# Patient Record
Sex: Female | Born: 1940
Health system: Southern US, Community
[De-identification: ages and names within clinical notes are randomized; demographics above are authoritative.]

## PROBLEM LIST (undated history)

## (undated) DIAGNOSIS — M1990 Unspecified osteoarthritis, unspecified site: Secondary | ICD-10-CM

## (undated) DIAGNOSIS — E079 Disorder of thyroid, unspecified: Secondary | ICD-10-CM

## (undated) DIAGNOSIS — IMO0002 Reserved for concepts with insufficient information to code with codable children: Secondary | ICD-10-CM

## (undated) DIAGNOSIS — K219 Gastro-esophageal reflux disease without esophagitis: Secondary | ICD-10-CM

## (undated) HISTORY — DX: Gastro-esophageal reflux disease without esophagitis: K21.9

## (undated) HISTORY — PX: TUBAL LIGATION: SHX77

## (undated) HISTORY — DX: Reserved for concepts with insufficient information to code with codable children: IMO0002

## (undated) HISTORY — DX: Unspecified osteoarthritis, unspecified site: M19.90

## (undated) HISTORY — PX: EYE SURGERY: SHX253

## (undated) HISTORY — DX: Disorder of thyroid, unspecified: E07.9

---

## 1999-12-22 ENCOUNTER — Encounter: Payer: Self-pay | Admitting: Family Medicine

## 1999-12-22 ENCOUNTER — Encounter: Admission: RE | Admit: 1999-12-22 | Discharge: 1999-12-22 | Payer: Self-pay | Admitting: Family Medicine

## 2002-06-18 ENCOUNTER — Other Ambulatory Visit: Admission: RE | Admit: 2002-06-18 | Discharge: 2002-06-18 | Payer: Self-pay | Admitting: Obstetrics and Gynecology

## 2004-05-21 ENCOUNTER — Ambulatory Visit (HOSPITAL_COMMUNITY): Admission: RE | Admit: 2004-05-21 | Discharge: 2004-05-21 | Payer: Self-pay | Admitting: Gastroenterology

## 2006-09-13 ENCOUNTER — Encounter: Admission: RE | Admit: 2006-09-13 | Discharge: 2006-09-13 | Payer: Self-pay | Admitting: Family Medicine

## 2006-10-24 ENCOUNTER — Ambulatory Visit (HOSPITAL_BASED_OUTPATIENT_CLINIC_OR_DEPARTMENT_OTHER): Admission: RE | Admit: 2006-10-24 | Discharge: 2006-10-24 | Payer: Self-pay | Admitting: Urology

## 2013-03-21 ENCOUNTER — Other Ambulatory Visit: Payer: Self-pay | Admitting: Family Medicine

## 2013-03-21 DIAGNOSIS — Z1231 Encounter for screening mammogram for malignant neoplasm of breast: Secondary | ICD-10-CM

## 2013-04-17 ENCOUNTER — Ambulatory Visit
Admission: RE | Admit: 2013-04-17 | Discharge: 2013-04-17 | Disposition: A | Discharge status: Home / Self Care | Payer: Medicare Other | Source: Ambulatory Visit | Attending: Family Medicine | Admitting: Family Medicine

## 2013-04-17 DIAGNOSIS — Z1231 Encounter for screening mammogram for malignant neoplasm of breast: Secondary | ICD-10-CM

## 2014-10-16 ENCOUNTER — Other Ambulatory Visit: Payer: Self-pay

## 2014-10-16 DIAGNOSIS — Z1231 Encounter for screening mammogram for malignant neoplasm of breast: Secondary | ICD-10-CM

## 2014-11-15 ENCOUNTER — Ambulatory Visit
Admission: RE | Admit: 2014-11-15 | Discharge: 2014-11-15 | Disposition: A | Discharge status: Home / Self Care | Payer: Medicare Other | Source: Ambulatory Visit

## 2014-11-15 ENCOUNTER — Other Ambulatory Visit: Payer: Self-pay

## 2014-11-15 DIAGNOSIS — Z1231 Encounter for screening mammogram for malignant neoplasm of breast: Secondary | ICD-10-CM

## 2016-09-23 ENCOUNTER — Other Ambulatory Visit: Payer: Self-pay | Admitting: Family Medicine

## 2016-09-23 DIAGNOSIS — Z1231 Encounter for screening mammogram for malignant neoplasm of breast: Secondary | ICD-10-CM

## 2016-10-12 ENCOUNTER — Ambulatory Visit
Admission: RE | Admit: 2016-10-12 | Discharge: 2016-10-12 | Disposition: A | Discharge status: Home / Self Care | Payer: Medicare Other | Source: Ambulatory Visit | Attending: Family Medicine | Admitting: Family Medicine

## 2016-10-12 DIAGNOSIS — Z1231 Encounter for screening mammogram for malignant neoplasm of breast: Secondary | ICD-10-CM

## 2016-12-17 DIAGNOSIS — H04123 Dry eye syndrome of bilateral lacrimal glands: Secondary | ICD-10-CM | POA: Diagnosis not present

## 2016-12-17 DIAGNOSIS — H2513 Age-related nuclear cataract, bilateral: Secondary | ICD-10-CM | POA: Diagnosis not present

## 2017-01-05 DIAGNOSIS — Z0120 Encounter for dental examination and cleaning without abnormal findings: Secondary | ICD-10-CM | POA: Diagnosis not present

## 2017-01-24 DIAGNOSIS — H9113 Presbycusis, bilateral: Secondary | ICD-10-CM | POA: Diagnosis not present

## 2017-01-24 DIAGNOSIS — Z6823 Body mass index (BMI) 23.0-23.9, adult: Secondary | ICD-10-CM | POA: Diagnosis not present

## 2017-01-24 DIAGNOSIS — Z0000 Encounter for general adult medical examination without abnormal findings: Secondary | ICD-10-CM | POA: Diagnosis not present

## 2017-01-24 DIAGNOSIS — G43009 Migraine without aura, not intractable, without status migrainosus: Secondary | ICD-10-CM | POA: Diagnosis not present

## 2017-01-24 DIAGNOSIS — Z87891 Personal history of nicotine dependence: Secondary | ICD-10-CM | POA: Diagnosis not present

## 2017-01-24 DIAGNOSIS — M1990 Unspecified osteoarthritis, unspecified site: Secondary | ICD-10-CM | POA: Diagnosis not present

## 2017-01-24 DIAGNOSIS — H259 Unspecified age-related cataract: Secondary | ICD-10-CM | POA: Diagnosis not present

## 2017-01-24 DIAGNOSIS — Z974 Presence of external hearing-aid: Secondary | ICD-10-CM | POA: Diagnosis not present

## 2017-01-28 DIAGNOSIS — M1990 Unspecified osteoarthritis, unspecified site: Secondary | ICD-10-CM | POA: Diagnosis not present

## 2017-01-28 DIAGNOSIS — Z23 Encounter for immunization: Secondary | ICD-10-CM | POA: Diagnosis not present

## 2017-01-28 DIAGNOSIS — R69 Illness, unspecified: Secondary | ICD-10-CM | POA: Diagnosis not present

## 2017-06-09 DIAGNOSIS — M79603 Pain in arm, unspecified: Secondary | ICD-10-CM | POA: Diagnosis not present

## 2017-06-09 DIAGNOSIS — R69 Illness, unspecified: Secondary | ICD-10-CM | POA: Diagnosis not present

## 2017-09-23 DIAGNOSIS — R69 Illness, unspecified: Secondary | ICD-10-CM | POA: Diagnosis not present

## 2017-10-03 DIAGNOSIS — D2239 Melanocytic nevi of other parts of face: Secondary | ICD-10-CM | POA: Diagnosis not present

## 2017-10-03 DIAGNOSIS — C44712 Basal cell carcinoma of skin of right lower limb, including hip: Secondary | ICD-10-CM | POA: Diagnosis not present

## 2017-10-03 DIAGNOSIS — L821 Other seborrheic keratosis: Secondary | ICD-10-CM | POA: Diagnosis not present

## 2017-10-03 DIAGNOSIS — L570 Actinic keratosis: Secondary | ICD-10-CM | POA: Diagnosis not present

## 2017-12-22 DIAGNOSIS — H04412 Chronic dacryocystitis of left lacrimal passage: Secondary | ICD-10-CM | POA: Diagnosis not present

## 2017-12-22 DIAGNOSIS — H04212 Epiphora due to excess lacrimation, left lacrimal gland: Secondary | ICD-10-CM | POA: Diagnosis not present

## 2017-12-22 DIAGNOSIS — H25813 Combined forms of age-related cataract, bilateral: Secondary | ICD-10-CM | POA: Diagnosis not present

## 2018-01-05 DIAGNOSIS — H04412 Chronic dacryocystitis of left lacrimal passage: Secondary | ICD-10-CM | POA: Diagnosis not present

## 2018-01-09 DIAGNOSIS — Z87891 Personal history of nicotine dependence: Secondary | ICD-10-CM | POA: Diagnosis not present

## 2018-01-09 DIAGNOSIS — Z85828 Personal history of other malignant neoplasm of skin: Secondary | ICD-10-CM | POA: Diagnosis not present

## 2018-01-09 DIAGNOSIS — H04129 Dry eye syndrome of unspecified lacrimal gland: Secondary | ICD-10-CM | POA: Diagnosis not present

## 2018-01-09 DIAGNOSIS — Z825 Family history of asthma and other chronic lower respiratory diseases: Secondary | ICD-10-CM | POA: Diagnosis not present

## 2018-02-06 DIAGNOSIS — M1990 Unspecified osteoarthritis, unspecified site: Secondary | ICD-10-CM | POA: Diagnosis not present

## 2018-02-06 DIAGNOSIS — Z1231 Encounter for screening mammogram for malignant neoplasm of breast: Secondary | ICD-10-CM | POA: Diagnosis not present

## 2018-02-06 DIAGNOSIS — R69 Illness, unspecified: Secondary | ICD-10-CM | POA: Diagnosis not present

## 2018-04-21 ENCOUNTER — Other Ambulatory Visit: Payer: Self-pay | Admitting: Family Medicine

## 2018-04-21 DIAGNOSIS — Z1231 Encounter for screening mammogram for malignant neoplasm of breast: Secondary | ICD-10-CM

## 2018-05-12 ENCOUNTER — Ambulatory Visit
Admission: RE | Admit: 2018-05-12 | Discharge: 2018-05-12 | Disposition: A | Discharge status: Home / Self Care | Payer: Medicare HMO | Source: Ambulatory Visit | Attending: Family Medicine | Admitting: Family Medicine

## 2018-05-12 DIAGNOSIS — Z1231 Encounter for screening mammogram for malignant neoplasm of breast: Secondary | ICD-10-CM

## 2018-10-03 DIAGNOSIS — L570 Actinic keratosis: Secondary | ICD-10-CM | POA: Diagnosis not present

## 2018-10-03 DIAGNOSIS — L821 Other seborrheic keratosis: Secondary | ICD-10-CM | POA: Diagnosis not present

## 2018-10-03 DIAGNOSIS — D2239 Melanocytic nevi of other parts of face: Secondary | ICD-10-CM | POA: Diagnosis not present

## 2018-10-06 DIAGNOSIS — R69 Illness, unspecified: Secondary | ICD-10-CM | POA: Diagnosis not present

## 2019-01-09 DIAGNOSIS — H04202 Unspecified epiphora, left lacrimal gland: Secondary | ICD-10-CM | POA: Diagnosis not present

## 2019-01-09 DIAGNOSIS — H25813 Combined forms of age-related cataract, bilateral: Secondary | ICD-10-CM | POA: Diagnosis not present

## 2019-01-09 DIAGNOSIS — H35372 Puckering of macula, left eye: Secondary | ICD-10-CM | POA: Diagnosis not present

## 2019-01-22 DIAGNOSIS — H2511 Age-related nuclear cataract, right eye: Secondary | ICD-10-CM | POA: Diagnosis not present

## 2019-02-05 DIAGNOSIS — H2512 Age-related nuclear cataract, left eye: Secondary | ICD-10-CM | POA: Diagnosis not present

## 2019-02-08 DIAGNOSIS — R69 Illness, unspecified: Secondary | ICD-10-CM | POA: Diagnosis not present

## 2019-02-08 DIAGNOSIS — Z136 Encounter for screening for cardiovascular disorders: Secondary | ICD-10-CM | POA: Diagnosis not present

## 2019-02-08 DIAGNOSIS — Z131 Encounter for screening for diabetes mellitus: Secondary | ICD-10-CM | POA: Diagnosis not present

## 2019-02-08 DIAGNOSIS — Z0000 Encounter for general adult medical examination without abnormal findings: Secondary | ICD-10-CM | POA: Diagnosis not present

## 2019-02-08 DIAGNOSIS — M1990 Unspecified osteoarthritis, unspecified site: Secondary | ICD-10-CM | POA: Diagnosis not present

## 2019-02-12 DIAGNOSIS — H2512 Age-related nuclear cataract, left eye: Secondary | ICD-10-CM | POA: Diagnosis not present

## 2019-07-30 ENCOUNTER — Other Ambulatory Visit: Payer: Self-pay | Admitting: Family Medicine

## 2019-07-30 DIAGNOSIS — Z1231 Encounter for screening mammogram for malignant neoplasm of breast: Secondary | ICD-10-CM

## 2019-07-31 ENCOUNTER — Ambulatory Visit
Admission: RE | Admit: 2019-07-31 | Discharge: 2019-07-31 | Disposition: A | Discharge status: Home / Self Care | Payer: Medicare HMO | Source: Ambulatory Visit | Attending: Family Medicine | Admitting: Family Medicine

## 2019-07-31 ENCOUNTER — Other Ambulatory Visit: Payer: Self-pay

## 2019-07-31 DIAGNOSIS — Z1231 Encounter for screening mammogram for malignant neoplasm of breast: Secondary | ICD-10-CM

## 2019-09-27 DIAGNOSIS — R69 Illness, unspecified: Secondary | ICD-10-CM | POA: Diagnosis not present

## 2019-10-04 DIAGNOSIS — D2239 Melanocytic nevi of other parts of face: Secondary | ICD-10-CM | POA: Diagnosis not present

## 2019-10-04 DIAGNOSIS — L570 Actinic keratosis: Secondary | ICD-10-CM | POA: Diagnosis not present

## 2019-10-04 DIAGNOSIS — D1801 Hemangioma of skin and subcutaneous tissue: Secondary | ICD-10-CM | POA: Diagnosis not present

## 2019-10-04 DIAGNOSIS — L821 Other seborrheic keratosis: Secondary | ICD-10-CM | POA: Diagnosis not present

## 2019-10-04 DIAGNOSIS — D485 Neoplasm of uncertain behavior of skin: Secondary | ICD-10-CM | POA: Diagnosis not present

## 2019-10-04 DIAGNOSIS — D224 Melanocytic nevi of scalp and neck: Secondary | ICD-10-CM | POA: Diagnosis not present

## 2019-10-04 DIAGNOSIS — D0471 Carcinoma in situ of skin of right lower limb, including hip: Secondary | ICD-10-CM | POA: Diagnosis not present

## 2019-10-10 DIAGNOSIS — Z809 Family history of malignant neoplasm, unspecified: Secondary | ICD-10-CM | POA: Diagnosis not present

## 2019-10-10 DIAGNOSIS — Z85828 Personal history of other malignant neoplasm of skin: Secondary | ICD-10-CM | POA: Diagnosis not present

## 2019-10-10 DIAGNOSIS — H04129 Dry eye syndrome of unspecified lacrimal gland: Secondary | ICD-10-CM | POA: Diagnosis not present

## 2019-10-10 DIAGNOSIS — Z87891 Personal history of nicotine dependence: Secondary | ICD-10-CM | POA: Diagnosis not present

## 2019-10-10 DIAGNOSIS — Z8249 Family history of ischemic heart disease and other diseases of the circulatory system: Secondary | ICD-10-CM | POA: Diagnosis not present

## 2019-10-10 DIAGNOSIS — Z7722 Contact with and (suspected) exposure to environmental tobacco smoke (acute) (chronic): Secondary | ICD-10-CM | POA: Diagnosis not present

## 2019-10-10 DIAGNOSIS — Z825 Family history of asthma and other chronic lower respiratory diseases: Secondary | ICD-10-CM | POA: Diagnosis not present

## 2019-10-10 DIAGNOSIS — Z791 Long term (current) use of non-steroidal anti-inflammatories (NSAID): Secondary | ICD-10-CM | POA: Diagnosis not present

## 2019-10-10 DIAGNOSIS — M1990 Unspecified osteoarthritis, unspecified site: Secondary | ICD-10-CM | POA: Diagnosis not present

## 2019-11-25 DIAGNOSIS — J329 Chronic sinusitis, unspecified: Secondary | ICD-10-CM | POA: Diagnosis not present

## 2019-11-25 DIAGNOSIS — Z20828 Contact with and (suspected) exposure to other viral communicable diseases: Secondary | ICD-10-CM | POA: Diagnosis not present

## 2020-03-03 DIAGNOSIS — Z0000 Encounter for general adult medical examination without abnormal findings: Secondary | ICD-10-CM | POA: Diagnosis not present

## 2020-03-03 DIAGNOSIS — E7800 Pure hypercholesterolemia, unspecified: Secondary | ICD-10-CM | POA: Diagnosis not present

## 2020-03-03 DIAGNOSIS — Z131 Encounter for screening for diabetes mellitus: Secondary | ICD-10-CM | POA: Diagnosis not present

## 2020-03-03 DIAGNOSIS — M1990 Unspecified osteoarthritis, unspecified site: Secondary | ICD-10-CM | POA: Diagnosis not present

## 2020-03-03 DIAGNOSIS — Z1389 Encounter for screening for other disorder: Secondary | ICD-10-CM | POA: Diagnosis not present

## 2020-03-03 DIAGNOSIS — R69 Illness, unspecified: Secondary | ICD-10-CM | POA: Diagnosis not present

## 2020-04-14 DIAGNOSIS — R69 Illness, unspecified: Secondary | ICD-10-CM | POA: Diagnosis not present

## 2020-05-06 DIAGNOSIS — H04123 Dry eye syndrome of bilateral lacrimal glands: Secondary | ICD-10-CM | POA: Diagnosis not present

## 2020-05-06 DIAGNOSIS — Z961 Presence of intraocular lens: Secondary | ICD-10-CM | POA: Diagnosis not present

## 2020-05-06 DIAGNOSIS — H00022 Hordeolum internum right lower eyelid: Secondary | ICD-10-CM | POA: Diagnosis not present

## 2020-05-06 DIAGNOSIS — H35372 Puckering of macula, left eye: Secondary | ICD-10-CM | POA: Diagnosis not present

## 2020-05-06 DIAGNOSIS — H26493 Other secondary cataract, bilateral: Secondary | ICD-10-CM | POA: Diagnosis not present

## 2020-06-24 DIAGNOSIS — M9905 Segmental and somatic dysfunction of pelvic region: Secondary | ICD-10-CM | POA: Diagnosis not present

## 2020-06-24 DIAGNOSIS — M5386 Other specified dorsopathies, lumbar region: Secondary | ICD-10-CM | POA: Diagnosis not present

## 2020-06-24 DIAGNOSIS — M7612 Psoas tendinitis, left hip: Secondary | ICD-10-CM | POA: Diagnosis not present

## 2020-06-24 DIAGNOSIS — M7632 Iliotibial band syndrome, left leg: Secondary | ICD-10-CM | POA: Diagnosis not present

## 2020-06-24 DIAGNOSIS — M150 Primary generalized (osteo)arthritis: Secondary | ICD-10-CM | POA: Diagnosis not present

## 2020-06-24 DIAGNOSIS — M47816 Spondylosis without myelopathy or radiculopathy, lumbar region: Secondary | ICD-10-CM | POA: Diagnosis not present

## 2020-06-24 DIAGNOSIS — M5136 Other intervertebral disc degeneration, lumbar region: Secondary | ICD-10-CM | POA: Diagnosis not present

## 2020-06-24 DIAGNOSIS — M810 Age-related osteoporosis without current pathological fracture: Secondary | ICD-10-CM | POA: Diagnosis not present

## 2020-06-24 DIAGNOSIS — M9903 Segmental and somatic dysfunction of lumbar region: Secondary | ICD-10-CM | POA: Diagnosis not present

## 2020-07-22 DIAGNOSIS — Z8249 Family history of ischemic heart disease and other diseases of the circulatory system: Secondary | ICD-10-CM | POA: Diagnosis not present

## 2020-07-22 DIAGNOSIS — Z825 Family history of asthma and other chronic lower respiratory diseases: Secondary | ICD-10-CM | POA: Diagnosis not present

## 2020-07-22 DIAGNOSIS — Z87891 Personal history of nicotine dependence: Secondary | ICD-10-CM | POA: Diagnosis not present

## 2020-07-22 DIAGNOSIS — Z809 Family history of malignant neoplasm, unspecified: Secondary | ICD-10-CM | POA: Diagnosis not present

## 2020-07-22 DIAGNOSIS — J309 Allergic rhinitis, unspecified: Secondary | ICD-10-CM | POA: Diagnosis not present

## 2020-07-22 DIAGNOSIS — M1990 Unspecified osteoarthritis, unspecified site: Secondary | ICD-10-CM | POA: Diagnosis not present

## 2020-08-08 DIAGNOSIS — S8255XA Nondisplaced fracture of medial malleolus of left tibia, initial encounter for closed fracture: Secondary | ICD-10-CM | POA: Diagnosis not present

## 2020-08-08 DIAGNOSIS — L0889 Other specified local infections of the skin and subcutaneous tissue: Secondary | ICD-10-CM | POA: Diagnosis not present

## 2020-08-19 ENCOUNTER — Ambulatory Visit: Payer: Medicare HMO | Admitting: Orthopedic Surgery

## 2020-08-19 DIAGNOSIS — S93401A Sprain of unspecified ligament of right ankle, initial encounter: Secondary | ICD-10-CM | POA: Diagnosis not present

## 2020-08-21 ENCOUNTER — Encounter: Payer: Self-pay | Admitting: Orthopedic Surgery

## 2020-08-21 NOTE — Progress Notes (Signed)
Office Visit Note   Patient: Christina Fowler           Date of Birth: 1941-05-10           MRN: [id number] Visit Date: 08/19/2020              Requested by: Gaynelle Arabian, MD [address] [place],  [place] [address] PCP: Gaynelle Arabian, MD  Chief Complaint  Patient presents with  . Left Ankle - Injury  . Left Leg - Open Wound      HPI: Patient is a 79 year old woman who was seen for initial evaluation for a fall on her patio on 08/01/2020.  Patient went to urgent care on 08/09/2020 and is seen today for initial evaluation patient has completed a 14-day course of Bactrim DS.  Patient states she could not wear the fracture boot because it was too heavy and was painful over the abrasion on the anterior aspect of her tibia she is currently using a cane.  Patient has a large abrasion on the anterior aspect of the tibia with fibrinous tissue and bloody drainage.  Radiographs on a disc show a tiny avulsion fracture of the tip of the medial malleolus radiographs were obtained on 08/09/2020.  Assessment & Plan: Visit Diagnoses:  1. Sprain of unspecified ligament of right ankle, initial encounter     Plan: For the abrasion recommend the patient wear the medical compression sock size medium wear this around-the-clock change daily wash with soap and water.  Recommend that she use a sneaker for ambulation.  Follow-Up Instructions: Return in about 4 weeks (around 09/16/2020).   Ortho Exam  Patient is alert, oriented, no adenopathy, well-dressed, normal affect, normal respiratory effort. Examination patient has good pulses she has a large abrasion over the tibial crest with fibrinous tissue and a black eschar.  Her calf measures 35 cm in circumference the abrasion is 10 cm in length.  There is no cellulitis no signs of deep infection.  Patient is tender to palpation of the medial malleolus as well as over the anterior talofibular ligament consistent with a sprain.  Patient has a  negative anterior drawer no ligamentous laxity.  Imaging: No results found. No images are attached to the encounter.  Labs: No results found for: HGBA1C, ESRSEDRATE, CRP, LABURIC, REPTSTATUS, GRAMSTAIN, CULT, LABORGA   No results found for: ALBUMIN, PREALBUMIN, LABURIC  No results found for: MG No results found for: VD25OH  No results found for: PREALBUMIN No flowsheet data found.   There is no height or weight on file to calculate BMI.  Orders:  No orders of the defined types were placed in this encounter.  No orders of the defined types were placed in this encounter.    Procedures: No procedures performed  Clinical Data: No additional findings.  ROS:  All other systems negative, except as noted in the HPI. Review of Systems  Objective: Vital Signs: There were no vitals taken for this visit.  Specialty Comments:  No specialty comments available.  PMFS History: There are no problems to display for this patient.  History reviewed. No pertinent past medical history.  History reviewed. No pertinent family history.  History reviewed. No pertinent surgical history. Social History   Occupational History  . Not on file  Tobacco Use  . Smoking status: Not on file  Substance and Sexual Activity  . Alcohol use: Not on file  . Drug use: Not on file  . Sexual activity: Not on file

## 2020-09-09 ENCOUNTER — Encounter: Payer: Self-pay | Admitting: Orthopedic Surgery

## 2020-09-09 ENCOUNTER — Other Ambulatory Visit: Payer: Self-pay

## 2020-09-09 ENCOUNTER — Ambulatory Visit: Payer: Medicare HMO | Admitting: Physician Assistant

## 2020-09-09 DIAGNOSIS — S93401A Sprain of unspecified ligament of right ankle, initial encounter: Secondary | ICD-10-CM

## 2020-09-09 NOTE — Progress Notes (Signed)
   Office Visit Note   Patient: Christina Fowler           Date of Birth: 18-Mar-1941           MRN: [id number] Visit Date: 09/09/2020              Requested by: Gaynelle Arabian, MD [address] [place],  [place] [address] PCP: Gaynelle Arabian, MD  Chief Complaint  Patient presents with  . Left Leg - Follow-up    S/p fall 07/26/20      HPI: The patient is a pleasant 79 year old woman who is here in her follow-up for her tibia abrasion.  This occurred approximately 6 weeks ago.  She has wearing a VIVE compression stocking she feels much better and feels her leg looks significantly better.  She is working on strengthening in her ankle.  She describes some tingling feelings in her ankle as well  Assessment & Plan: Visit Diagnoses: No diagnosis found.  Plan: She continue to wear her compression sock.  Work on ankle range of motion especially dorsiflexion stretching.  This was demonstrated to her today.  She may follow-up as needed Follow-Up Instructions: No follow-ups on file.   Ortho Exam  Patient is alert, oriented, no adenopathy, well-dressed, normal affect, normal respiratory effort. Anterior should be tibia.  She has mild soft tissue swelling in her ankle.  Mildly tender over her ATFL.  She has a thin eschar along top of her tibia that measures about 8 cm.  It is not have any drainage or surrounding cellulitis.  Some of it has started to resolve.  Compartments are soft and nontender no sign of infection  Imaging: No results found. No images are attached to the encounter.  Labs: No results found for: HGBA1C, ESRSEDRATE, CRP, LABURIC, REPTSTATUS, GRAMSTAIN, CULT, LABORGA   No results found for: ALBUMIN, PREALBUMIN, LABURIC  No results found for: MG No results found for: VD25OH  No results found for: PREALBUMIN No flowsheet data found.   There is no height or weight on file to calculate BMI.  Orders:  No orders of the defined types were placed in this  encounter.  No orders of the defined types were placed in this encounter.    Procedures: No procedures performed  Clinical Data: No additional findings.  ROS:  All other systems negative, except as noted in the HPI. Review of Systems  Objective: Vital Signs: There were no vitals taken for this visit.  Specialty Comments:  No specialty comments available.  PMFS History: There are no problems to display for this patient.  No past medical history on file.  No family history on file.  No past surgical history on file. Social History   Occupational History  . Not on file  Tobacco Use  . Smoking status: Not on file  Substance and Sexual Activity  . Alcohol use: Not on file  . Drug use: Not on file  . Sexual activity: Not on file

## 2020-10-02 DIAGNOSIS — D2271 Melanocytic nevi of right lower limb, including hip: Secondary | ICD-10-CM | POA: Diagnosis not present

## 2020-10-02 DIAGNOSIS — L820 Inflamed seborrheic keratosis: Secondary | ICD-10-CM | POA: Diagnosis not present

## 2020-10-02 DIAGNOSIS — L570 Actinic keratosis: Secondary | ICD-10-CM | POA: Diagnosis not present

## 2020-10-02 DIAGNOSIS — D1801 Hemangioma of skin and subcutaneous tissue: Secondary | ICD-10-CM | POA: Diagnosis not present

## 2020-10-02 DIAGNOSIS — D2272 Melanocytic nevi of left lower limb, including hip: Secondary | ICD-10-CM | POA: Diagnosis not present

## 2020-10-02 DIAGNOSIS — L821 Other seborrheic keratosis: Secondary | ICD-10-CM | POA: Diagnosis not present

## 2020-10-02 DIAGNOSIS — L814 Other melanin hyperpigmentation: Secondary | ICD-10-CM | POA: Diagnosis not present

## 2020-10-06 ENCOUNTER — Other Ambulatory Visit: Payer: Self-pay | Admitting: Family Medicine

## 2020-10-06 DIAGNOSIS — Z1231 Encounter for screening mammogram for malignant neoplasm of breast: Secondary | ICD-10-CM

## 2020-10-07 ENCOUNTER — Other Ambulatory Visit: Payer: Self-pay

## 2020-10-07 ENCOUNTER — Ambulatory Visit
Admission: RE | Admit: 2020-10-07 | Discharge: 2020-10-07 | Disposition: A | Discharge status: Home / Self Care | Payer: Medicare HMO | Source: Ambulatory Visit | Attending: Family Medicine | Admitting: Family Medicine

## 2020-10-07 DIAGNOSIS — Z1231 Encounter for screening mammogram for malignant neoplasm of breast: Secondary | ICD-10-CM | POA: Diagnosis not present

## 2020-10-17 ENCOUNTER — Other Ambulatory Visit: Payer: Self-pay | Admitting: Family Medicine

## 2020-10-17 DIAGNOSIS — M8580 Other specified disorders of bone density and structure, unspecified site: Secondary | ICD-10-CM

## 2021-01-14 ENCOUNTER — Other Ambulatory Visit: Payer: Medicare HMO

## 2021-01-21 ENCOUNTER — Other Ambulatory Visit: Payer: Self-pay

## 2021-01-21 ENCOUNTER — Ambulatory Visit
Admission: RE | Admit: 2021-01-21 | Discharge: 2021-01-21 | Disposition: A | Discharge status: Home / Self Care | Payer: Medicare HMO | Source: Ambulatory Visit | Attending: Family Medicine | Admitting: Family Medicine

## 2021-01-21 DIAGNOSIS — Z780 Asymptomatic menopausal state: Secondary | ICD-10-CM | POA: Diagnosis not present

## 2021-01-21 DIAGNOSIS — M8589 Other specified disorders of bone density and structure, multiple sites: Secondary | ICD-10-CM | POA: Diagnosis not present

## 2021-01-21 DIAGNOSIS — M8580 Other specified disorders of bone density and structure, unspecified site: Secondary | ICD-10-CM

## 2021-03-27 DIAGNOSIS — E7800 Pure hypercholesterolemia, unspecified: Secondary | ICD-10-CM | POA: Diagnosis not present

## 2021-03-27 DIAGNOSIS — Z131 Encounter for screening for diabetes mellitus: Secondary | ICD-10-CM | POA: Diagnosis not present

## 2021-03-27 DIAGNOSIS — Z0000 Encounter for general adult medical examination without abnormal findings: Secondary | ICD-10-CM | POA: Diagnosis not present

## 2021-03-27 DIAGNOSIS — R69 Illness, unspecified: Secondary | ICD-10-CM | POA: Diagnosis not present

## 2021-03-27 DIAGNOSIS — M1990 Unspecified osteoarthritis, unspecified site: Secondary | ICD-10-CM | POA: Diagnosis not present

## 2021-03-27 DIAGNOSIS — Z1389 Encounter for screening for other disorder: Secondary | ICD-10-CM | POA: Diagnosis not present

## 2021-05-07 DIAGNOSIS — H43822 Vitreomacular adhesion, left eye: Secondary | ICD-10-CM | POA: Diagnosis not present

## 2021-05-07 DIAGNOSIS — H26493 Other secondary cataract, bilateral: Secondary | ICD-10-CM | POA: Diagnosis not present

## 2021-05-07 DIAGNOSIS — Z961 Presence of intraocular lens: Secondary | ICD-10-CM | POA: Diagnosis not present

## 2021-05-07 DIAGNOSIS — H04123 Dry eye syndrome of bilateral lacrimal glands: Secondary | ICD-10-CM | POA: Diagnosis not present

## 2021-05-07 DIAGNOSIS — H35372 Puckering of macula, left eye: Secondary | ICD-10-CM | POA: Diagnosis not present

## 2021-06-11 ENCOUNTER — Other Ambulatory Visit: Payer: Medicare HMO

## 2021-06-16 DIAGNOSIS — Z87891 Personal history of nicotine dependence: Secondary | ICD-10-CM | POA: Diagnosis not present

## 2021-06-16 DIAGNOSIS — Z791 Long term (current) use of non-steroidal anti-inflammatories (NSAID): Secondary | ICD-10-CM | POA: Diagnosis not present

## 2021-06-16 DIAGNOSIS — R69 Illness, unspecified: Secondary | ICD-10-CM | POA: Diagnosis not present

## 2021-06-16 DIAGNOSIS — Z820 Family history of epilepsy and other diseases of the nervous system: Secondary | ICD-10-CM | POA: Diagnosis not present

## 2021-06-16 DIAGNOSIS — Z7722 Contact with and (suspected) exposure to environmental tobacco smoke (acute) (chronic): Secondary | ICD-10-CM | POA: Diagnosis not present

## 2021-06-16 DIAGNOSIS — Z809 Family history of malignant neoplasm, unspecified: Secondary | ICD-10-CM | POA: Diagnosis not present

## 2021-06-16 DIAGNOSIS — G8929 Other chronic pain: Secondary | ICD-10-CM | POA: Diagnosis not present

## 2021-06-16 DIAGNOSIS — M1990 Unspecified osteoarthritis, unspecified site: Secondary | ICD-10-CM | POA: Diagnosis not present

## 2021-06-16 DIAGNOSIS — J309 Allergic rhinitis, unspecified: Secondary | ICD-10-CM | POA: Diagnosis not present

## 2021-09-15 ENCOUNTER — Other Ambulatory Visit: Payer: Self-pay | Admitting: Family Medicine

## 2021-09-15 DIAGNOSIS — Z1231 Encounter for screening mammogram for malignant neoplasm of breast: Secondary | ICD-10-CM

## 2021-10-08 DIAGNOSIS — L820 Inflamed seborrheic keratosis: Secondary | ICD-10-CM | POA: Diagnosis not present

## 2021-10-08 DIAGNOSIS — L570 Actinic keratosis: Secondary | ICD-10-CM | POA: Diagnosis not present

## 2021-10-08 DIAGNOSIS — D2239 Melanocytic nevi of other parts of face: Secondary | ICD-10-CM | POA: Diagnosis not present

## 2021-10-08 DIAGNOSIS — D0472 Carcinoma in situ of skin of left lower limb, including hip: Secondary | ICD-10-CM | POA: Diagnosis not present

## 2021-10-08 DIAGNOSIS — D485 Neoplasm of uncertain behavior of skin: Secondary | ICD-10-CM | POA: Diagnosis not present

## 2021-10-08 DIAGNOSIS — L821 Other seborrheic keratosis: Secondary | ICD-10-CM | POA: Diagnosis not present

## 2021-10-12 ENCOUNTER — Ambulatory Visit
Admission: RE | Admit: 2021-10-12 | Discharge: 2021-10-12 | Disposition: A | Discharge status: Home / Self Care | Payer: Medicare HMO | Source: Ambulatory Visit | Attending: Family Medicine | Admitting: Family Medicine

## 2021-10-12 ENCOUNTER — Other Ambulatory Visit: Payer: Self-pay

## 2021-10-12 DIAGNOSIS — Z1231 Encounter for screening mammogram for malignant neoplasm of breast: Secondary | ICD-10-CM

## 2022-03-31 DIAGNOSIS — Z1389 Encounter for screening for other disorder: Secondary | ICD-10-CM | POA: Diagnosis not present

## 2022-03-31 DIAGNOSIS — E7800 Pure hypercholesterolemia, unspecified: Secondary | ICD-10-CM | POA: Diagnosis not present

## 2022-03-31 DIAGNOSIS — Z0000 Encounter for general adult medical examination without abnormal findings: Secondary | ICD-10-CM | POA: Diagnosis not present

## 2022-03-31 DIAGNOSIS — L659 Nonscarring hair loss, unspecified: Secondary | ICD-10-CM | POA: Diagnosis not present

## 2022-03-31 DIAGNOSIS — F325 Major depressive disorder, single episode, in full remission: Secondary | ICD-10-CM | POA: Diagnosis not present

## 2022-03-31 DIAGNOSIS — R69 Illness, unspecified: Secondary | ICD-10-CM | POA: Diagnosis not present

## 2022-03-31 DIAGNOSIS — Z131 Encounter for screening for diabetes mellitus: Secondary | ICD-10-CM | POA: Diagnosis not present

## 2022-03-31 DIAGNOSIS — M1990 Unspecified osteoarthritis, unspecified site: Secondary | ICD-10-CM | POA: Diagnosis not present

## 2022-04-08 DIAGNOSIS — L820 Inflamed seborrheic keratosis: Secondary | ICD-10-CM | POA: Diagnosis not present

## 2022-04-08 DIAGNOSIS — L650 Telogen effluvium: Secondary | ICD-10-CM | POA: Diagnosis not present

## 2022-04-26 ENCOUNTER — Encounter: Payer: Self-pay | Admitting: Orthopedic Surgery

## 2022-04-26 ENCOUNTER — Ambulatory Visit: Payer: Medicare HMO | Admitting: Orthopedic Surgery

## 2022-04-26 ENCOUNTER — Ambulatory Visit (INDEPENDENT_AMBULATORY_CARE_PROVIDER_SITE_OTHER): Payer: Medicare HMO

## 2022-04-26 DIAGNOSIS — M25512 Pain in left shoulder: Secondary | ICD-10-CM

## 2022-04-26 MED ORDER — LIDOCAINE HCL 1 % IJ SOLN
5.0000 mL | INTRAMUSCULAR | Status: AC | PRN
  Administered 2022-04-26: 5 mL

## 2022-04-26 MED ORDER — METHYLPREDNISOLONE ACETATE 40 MG/ML IJ SUSP
40.0000 mg | INTRAMUSCULAR | Status: AC | PRN
  Administered 2022-04-26: 40 mg via INTRA_ARTICULAR

## 2022-04-26 NOTE — Progress Notes (Signed)
Office Visit Note   Patient: Christina Fowler           Date of Birth: 04-11-1941           MRN: [id number] Visit Date: 04/26/2022              Requested by: Gaynelle Arabian, MD [address] [place],  [place] [address] PCP: Gaynelle Arabian, MD  Chief Complaint  Patient presents with   Left Arm - Pain      HPI: Patient is an 81 year old woman who complains of pain and swelling anterior lateral left shoulder.  She states she has decreased range of motion of the shoulder with symptoms starting over a month ago.  She has used heat and ice without relief.  Assessment & Plan: Visit Diagnoses:  1. Left shoulder pain, unspecified chronicity     Plan: Left shoulder subacromial space was injected reevaluate in 4 weeks.  Follow-Up Instructions: Return in about 4 weeks (around 05/24/2022).   Ortho Exam  Patient is alert, oriented, no adenopathy, well-dressed, normal affect, normal respiratory effort. Examination patient has abduction and flexion of the left shoulder to 90 degrees.  She has pain with Neer and Hawkins impingement test the [facility] joint is not tender to palpation the biceps tendon is nontender to palpation.  Imaging: XR Shoulder Left  Result Date: 04/26/2022 2 view radiographs of the left shoulder shows decreased subacromial joint space with superior migration of the humeral head within the glenoid  No images are attached to the encounter.  Labs: No results found for: HGBA1C, ESRSEDRATE, CRP, LABURIC, REPTSTATUS, GRAMSTAIN, CULT, LABORGA   No results found for: ALBUMIN, PREALBUMIN, CBC  No results found for: MG No results found for: VD25OH  No results found for: PREALBUMIN     View : No data to display.           There is no height or weight on file to calculate BMI.  Orders:  Orders Placed This Encounter  Procedures   XR Shoulder Left   No orders of the defined types were placed in this encounter.    Procedures: Large Joint Inj: L  subacromial bursa on 04/26/2022 11:56 AM Indications: diagnostic evaluation and pain Details: 22 G 1.5 in needle, posterior approach  Arthrogram: No  Medications: 5 mL lidocaine 1 %; 40 mg methylPREDNISolone acetate 40 MG/ML Outcome: tolerated well, no immediate complications Procedure, treatment alternatives, risks and benefits explained, specific risks discussed. Consent was given by the patient. Immediately prior to procedure a time out was called to verify the correct patient, procedure, equipment, support staff and site/side marked as required. Patient was prepped and draped in the usual sterile fashion.     Clinical Data: No additional findings.  ROS:  All other systems negative, except as noted in the HPI. Review of Systems  Objective: Vital Signs: There were no vitals taken for this visit.  Specialty Comments:  No specialty comments available.  PMFS History: There are no problems to display for this patient.  History reviewed. No pertinent past medical history.  History reviewed. No pertinent family history.  History reviewed. No pertinent surgical history. Social History   Occupational History   Not on file  Tobacco Use   Smoking status: Not on file   Smokeless tobacco: Not on file  Substance and Sexual Activity   Alcohol use: Not on file   Drug use: Not on file   Sexual activity: Not on file

## 2022-05-04 DIAGNOSIS — R946 Abnormal results of thyroid function studies: Secondary | ICD-10-CM | POA: Diagnosis not present

## 2022-05-11 DIAGNOSIS — H04123 Dry eye syndrome of bilateral lacrimal glands: Secondary | ICD-10-CM | POA: Diagnosis not present

## 2022-05-11 DIAGNOSIS — H35372 Puckering of macula, left eye: Secondary | ICD-10-CM | POA: Diagnosis not present

## 2022-05-11 DIAGNOSIS — Z961 Presence of intraocular lens: Secondary | ICD-10-CM | POA: Diagnosis not present

## 2022-05-11 DIAGNOSIS — H26493 Other secondary cataract, bilateral: Secondary | ICD-10-CM | POA: Diagnosis not present

## 2022-05-11 DIAGNOSIS — H43822 Vitreomacular adhesion, left eye: Secondary | ICD-10-CM | POA: Diagnosis not present

## 2022-05-24 ENCOUNTER — Ambulatory Visit (INDEPENDENT_AMBULATORY_CARE_PROVIDER_SITE_OTHER): Payer: Medicare HMO | Admitting: Orthopedic Surgery

## 2022-05-24 ENCOUNTER — Encounter: Payer: Self-pay | Admitting: Orthopedic Surgery

## 2022-05-24 DIAGNOSIS — M7542 Impingement syndrome of left shoulder: Secondary | ICD-10-CM

## 2022-05-24 NOTE — Progress Notes (Signed)
   Office Visit Note   Patient: Christina Fowler           Date of Birth: 11-15-1941           MRN: [id number] Visit Date: 05/24/2022              Requested by: Gaynelle Arabian, MD [address] [place],  [place] [address] PCP: Gaynelle Arabian, MD  Chief Complaint  Patient presents with   Left Shoulder - Pain      HPI: Patient is an 81 year old woman who is having persistent pain in the left shoulder she states the injection did not help much .  She has pain with elevation and internal rotation. Assessment & Plan: Visit Diagnoses:  1. Impingement syndrome of left shoulder     Plan: We will request an MRI scan of the left shoulder to evaluate for rotator cuff pathology.  Follow-Up Instructions: Return in about 2 weeks (around 06/07/2022).   Ortho Exam  Patient is alert, oriented, no adenopathy, well-dressed, normal affect, normal respiratory effort. Examination patient has full range of motion of the left shoulder she has pain to palpation over the biceps tendon she has pain with Neer and Hawkins impingement test.  Pain with drop arm test.  Imaging: No results found. No images are attached to the encounter.  Labs: No results found for: "HGBA1C", "ESRSEDRATE", "CRP", "LABURIC", "REPTSTATUS", "GRAMSTAIN", "CULT", "LABORGA"   No results found for: "ALBUMIN", "PREALBUMIN", "CBC"  No results found for: "MG" No results found for: "VD25OH"  No results found for: "PREALBUMIN"     No data to display           There is no height or weight on file to calculate BMI.  Orders:  Orders Placed This Encounter  Procedures   MR Shoulder Left w/o contrast   No orders of the defined types were placed in this encounter.    Procedures: No procedures performed  Clinical Data: No additional findings.  ROS:  All other systems negative, except as noted in the HPI. Review of Systems  Objective: Vital Signs: There were no vitals taken for this  visit.  Specialty Comments:  No specialty comments available.  PMFS History: There are no problems to display for this patient.  History reviewed. No pertinent past medical history.  History reviewed. No pertinent family history.  History reviewed. No pertinent surgical history. Social History   Occupational History   Not on file  Tobacco Use   Smoking status: Not on file   Smokeless tobacco: Not on file  Substance and Sexual Activity   Alcohol use: Not on file   Drug use: Not on file   Sexual activity: Not on file

## 2022-05-26 DIAGNOSIS — H26492 Other secondary cataract, left eye: Secondary | ICD-10-CM | POA: Diagnosis not present

## 2022-06-01 DIAGNOSIS — Z791 Long term (current) use of non-steroidal anti-inflammatories (NSAID): Secondary | ICD-10-CM | POA: Diagnosis not present

## 2022-06-01 DIAGNOSIS — M1990 Unspecified osteoarthritis, unspecified site: Secondary | ICD-10-CM | POA: Diagnosis not present

## 2022-06-01 DIAGNOSIS — R030 Elevated blood-pressure reading, without diagnosis of hypertension: Secondary | ICD-10-CM | POA: Diagnosis not present

## 2022-06-01 DIAGNOSIS — Z008 Encounter for other general examination: Secondary | ICD-10-CM | POA: Diagnosis not present

## 2022-06-01 DIAGNOSIS — R32 Unspecified urinary incontinence: Secondary | ICD-10-CM | POA: Diagnosis not present

## 2022-06-01 DIAGNOSIS — Z809 Family history of malignant neoplasm, unspecified: Secondary | ICD-10-CM | POA: Diagnosis not present

## 2022-06-01 DIAGNOSIS — Z825 Family history of asthma and other chronic lower respiratory diseases: Secondary | ICD-10-CM | POA: Diagnosis not present

## 2022-06-01 DIAGNOSIS — Z87891 Personal history of nicotine dependence: Secondary | ICD-10-CM | POA: Diagnosis not present

## 2022-06-01 DIAGNOSIS — E039 Hypothyroidism, unspecified: Secondary | ICD-10-CM | POA: Diagnosis not present

## 2022-06-02 ENCOUNTER — Telehealth: Payer: Self-pay | Admitting: Orthopedic Surgery

## 2022-06-02 NOTE — Telephone Encounter (Signed)
Called patient to schedule MRI review. She would like an appointment with Dr. Sharol Given before 7/20 to go over the MRI. Her call back number is [phone]

## 2022-06-04 NOTE — Telephone Encounter (Signed)
SW pt, she is scheduled for MRI review on 06/10/22

## 2022-06-05 ENCOUNTER — Ambulatory Visit
Admission: RE | Admit: 2022-06-05 | Discharge: 2022-06-05 | Disposition: A | Discharge status: Home / Self Care | Payer: Medicare HMO | Source: Ambulatory Visit | Attending: Orthopedic Surgery | Admitting: Orthopedic Surgery

## 2022-06-05 DIAGNOSIS — M19012 Primary osteoarthritis, left shoulder: Secondary | ICD-10-CM | POA: Diagnosis not present

## 2022-06-05 DIAGNOSIS — M25412 Effusion, left shoulder: Secondary | ICD-10-CM | POA: Diagnosis not present

## 2022-06-05 DIAGNOSIS — S46012A Strain of muscle(s) and tendon(s) of the rotator cuff of left shoulder, initial encounter: Secondary | ICD-10-CM | POA: Diagnosis not present

## 2022-06-05 DIAGNOSIS — M7542 Impingement syndrome of left shoulder: Secondary | ICD-10-CM

## 2022-06-09 ENCOUNTER — Encounter: Payer: Self-pay | Admitting: Orthopedic Surgery

## 2022-06-09 ENCOUNTER — Ambulatory Visit: Payer: Medicare HMO | Admitting: Orthopedic Surgery

## 2022-06-09 DIAGNOSIS — M75122 Complete rotator cuff tear or rupture of left shoulder, not specified as traumatic: Secondary | ICD-10-CM | POA: Diagnosis not present

## 2022-06-09 DIAGNOSIS — M7542 Impingement syndrome of left shoulder: Secondary | ICD-10-CM

## 2022-06-09 NOTE — Progress Notes (Addendum)
   Office Visit Note   Patient: Christina Fowler           Date of Birth: September 21, 1941           MRN: [id number] Visit Date: 06/09/2022              Requested by: Gaynelle Arabian, MD [address] [place],  [place] [address] PCP: Gaynelle Arabian, MD  Chief Complaint  Patient presents with   Left Shoulder - Follow-up    MRI review       HPI: Patient is an 81 year old woman who presents in follow-up for pain left shoulder difficulty with elevation and internal rotation activities of daily living.  She is status post an MRI scan.  Assessment & Plan: Visit Diagnoses:  1. Impingement syndrome of left shoulder   2. Nontraumatic complete tear of left rotator cuff     Plan: Discussed with the patient with the partial tearing of the rotator cuff her option would be to proceed with arthroscopic debridement with possible therapy afterwards or proceeding with physical therapy initially.  Discussed that both treatment options should provide approximately 75% pain relief.  Patient states she would like to proceed with therapy at this time and will plan on therapy if needed.  Follow-Up Instructions: Return in about 2 weeks (around 06/23/2022).   Ortho Exam  Patient is alert, oriented, no adenopathy, well-dressed, normal affect, normal respiratory effort. Examination patient has abduction and flexion to 90 degrees.  She has pain to palpation of the biceps tendon pain with Neer and Hawkins impingement test.  Review of the MRI scan shows tear of the supraspinatus tendon with retraction tendinopathy with partial-thickness tearing of the infraspinatus intact teres minor subscapularis and long head of the biceps.  Imaging: No results found. No images are attached to the encounter.  Labs: No results found for: "HGBA1C", "ESRSEDRATE", "CRP", "LABURIC", "REPTSTATUS", "GRAMSTAIN", "CULT", "LABORGA"   No results found for: "ALBUMIN", "PREALBUMIN", "CBC"  No results found for:  "MG" No results found for: "VD25OH"  No results found for: "PREALBUMIN"     No data to display           There is no height or weight on file to calculate BMI.  Orders:  No orders of the defined types were placed in this encounter.  No orders of the defined types were placed in this encounter.    Procedures: No procedures performed  Clinical Data: No additional findings.  ROS:  All other systems negative, except as noted in the HPI. Review of Systems  Objective: Vital Signs: There were no vitals taken for this visit.  Specialty Comments:  No specialty comments available.  PMFS History: There are no problems to display for this patient.  History reviewed. No pertinent past medical history.  History reviewed. No pertinent family history.  History reviewed. No pertinent surgical history. Social History   Occupational History   Not on file  Tobacco Use   Smoking status: Not on file   Smokeless tobacco: Not on file  Substance and Sexual Activity   Alcohol use: Not on file   Drug use: Not on file   Sexual activity: Not on file

## 2022-06-10 ENCOUNTER — Ambulatory Visit: Payer: Medicare HMO | Admitting: Orthopedic Surgery

## 2022-06-24 ENCOUNTER — Ambulatory Visit: Payer: Medicare HMO | Admitting: Orthopedic Surgery

## 2022-06-28 ENCOUNTER — Ambulatory Visit: Payer: Medicare HMO | Admitting: Orthopedic Surgery

## 2022-07-01 DIAGNOSIS — E039 Hypothyroidism, unspecified: Secondary | ICD-10-CM | POA: Diagnosis not present

## 2022-10-12 DIAGNOSIS — L821 Other seborrheic keratosis: Secondary | ICD-10-CM | POA: Diagnosis not present

## 2022-10-12 DIAGNOSIS — L570 Actinic keratosis: Secondary | ICD-10-CM | POA: Diagnosis not present

## 2022-10-12 DIAGNOSIS — D225 Melanocytic nevi of trunk: Secondary | ICD-10-CM | POA: Diagnosis not present

## 2022-10-14 DIAGNOSIS — H35372 Puckering of macula, left eye: Secondary | ICD-10-CM | POA: Diagnosis not present

## 2022-10-14 DIAGNOSIS — Z961 Presence of intraocular lens: Secondary | ICD-10-CM | POA: Diagnosis not present

## 2022-10-14 DIAGNOSIS — H04123 Dry eye syndrome of bilateral lacrimal glands: Secondary | ICD-10-CM | POA: Diagnosis not present

## 2022-10-14 DIAGNOSIS — H5310 Unspecified subjective visual disturbances: Secondary | ICD-10-CM | POA: Diagnosis not present

## 2022-10-14 DIAGNOSIS — H43822 Vitreomacular adhesion, left eye: Secondary | ICD-10-CM | POA: Diagnosis not present

## 2023-04-04 DIAGNOSIS — F325 Major depressive disorder, single episode, in full remission: Secondary | ICD-10-CM | POA: Diagnosis not present

## 2023-04-04 DIAGNOSIS — Z1331 Encounter for screening for depression: Secondary | ICD-10-CM | POA: Diagnosis not present

## 2023-04-04 DIAGNOSIS — M1990 Unspecified osteoarthritis, unspecified site: Secondary | ICD-10-CM | POA: Diagnosis not present

## 2023-04-04 DIAGNOSIS — E7800 Pure hypercholesterolemia, unspecified: Secondary | ICD-10-CM | POA: Diagnosis not present

## 2023-04-04 DIAGNOSIS — E039 Hypothyroidism, unspecified: Secondary | ICD-10-CM | POA: Diagnosis not present

## 2023-04-04 DIAGNOSIS — Z131 Encounter for screening for diabetes mellitus: Secondary | ICD-10-CM | POA: Diagnosis not present

## 2023-04-04 DIAGNOSIS — Z0000 Encounter for general adult medical examination without abnormal findings: Secondary | ICD-10-CM | POA: Diagnosis not present

## 2023-04-04 LAB — LAB REPORT - SCANNED: TSH: 1.67 (ref 0.41–5.90)

## 2023-05-23 DIAGNOSIS — F325 Major depressive disorder, single episode, in full remission: Secondary | ICD-10-CM | POA: Diagnosis not present

## 2023-05-23 DIAGNOSIS — E039 Hypothyroidism, unspecified: Secondary | ICD-10-CM | POA: Diagnosis not present

## 2023-05-23 DIAGNOSIS — Z791 Long term (current) use of non-steroidal anti-inflammatories (NSAID): Secondary | ICD-10-CM | POA: Diagnosis not present

## 2023-05-23 DIAGNOSIS — Z8249 Family history of ischemic heart disease and other diseases of the circulatory system: Secondary | ICD-10-CM | POA: Diagnosis not present

## 2023-05-23 DIAGNOSIS — K219 Gastro-esophageal reflux disease without esophagitis: Secondary | ICD-10-CM | POA: Diagnosis not present

## 2023-05-23 DIAGNOSIS — M8580 Other specified disorders of bone density and structure, unspecified site: Secondary | ICD-10-CM | POA: Diagnosis not present

## 2023-05-23 DIAGNOSIS — Z809 Family history of malignant neoplasm, unspecified: Secondary | ICD-10-CM | POA: Diagnosis not present

## 2023-05-23 DIAGNOSIS — M1990 Unspecified osteoarthritis, unspecified site: Secondary | ICD-10-CM | POA: Diagnosis not present

## 2023-05-23 DIAGNOSIS — Z87891 Personal history of nicotine dependence: Secondary | ICD-10-CM | POA: Diagnosis not present

## 2023-05-23 DIAGNOSIS — Z825 Family history of asthma and other chronic lower respiratory diseases: Secondary | ICD-10-CM | POA: Diagnosis not present

## 2023-09-07 ENCOUNTER — Encounter: Payer: Self-pay | Admitting: Family Medicine

## 2023-09-07 ENCOUNTER — Ambulatory Visit (INDEPENDENT_AMBULATORY_CARE_PROVIDER_SITE_OTHER): Payer: Medicare HMO | Admitting: Family Medicine

## 2023-09-07 VITALS — BP 122/79 | HR 63 | Ht 64.5 in | Wt 135.4 lb

## 2023-09-07 DIAGNOSIS — M1990 Unspecified osteoarthritis, unspecified site: Secondary | ICD-10-CM | POA: Insufficient documentation

## 2023-09-07 DIAGNOSIS — M150 Primary generalized (osteo)arthritis: Secondary | ICD-10-CM | POA: Diagnosis not present

## 2023-09-07 DIAGNOSIS — J309 Allergic rhinitis, unspecified: Secondary | ICD-10-CM | POA: Diagnosis not present

## 2023-09-07 DIAGNOSIS — E039 Hypothyroidism, unspecified: Secondary | ICD-10-CM | POA: Insufficient documentation

## 2023-09-07 NOTE — Assessment & Plan Note (Signed)
Patient takes over-the-counter pain relieving medications.  Tries to use Voltaren gel as well but this is not very effective.  Has arthritis in multiple joints including hands hips and shoulders.

## 2023-09-07 NOTE — Assessment & Plan Note (Signed)
Continue Claritin as needed 

## 2023-09-07 NOTE — Patient Instructions (Signed)
It was nice to see you today,  We addressed the following topics today: -I will try to get records from [facility] for your most recent lab work. - No changes to your medications otherwise. - You can follow-up with [place] in 1 year or sooner if you need to.  Have a great day,  Frederic Jericho, MD

## 2023-09-07 NOTE — Assessment & Plan Note (Addendum)
Patient takes 25 mcg levothyroxine.  Requesting records from [facility] for previous TSH values.  Continue current dose.

## 2023-09-07 NOTE — Progress Notes (Signed)
New Patient Office Visit  Subjective    Patient ID: Christina Fowler, female    DOB: 1941-09-13  Age: 82 y.o. MRN: [id number]  CC:  Chief Complaint  Patient presents with   New Patient (Initial Visit)    HPI Christina Fowler presents to establish care. Patient previously was going to Dr. Manus Gunning at [facility] she has been seeing him since she moved here about 40 years.  She currently takes Synthroid 25 mcg, takes as needed ibuprofen and Excedrin Migraine for for arthritis, Claritin as needed for allergies, vitamin D, vitamin B12, potassium supplementation, and Centrum Silver.  PMH: Hypothyroid, osteoarthritis, kidney stones, depression, BCC, allergic rhinitis, hypercholesterolemia.    PSH: Cataracts.    FH: sister - esophageal cancer; other sister copd; father - unknown cancer?   Tobacco use: hasn't smoked in over 30 years.   Alcohol use: rare.  Drug use: none Marital status: widowed.  Lives alone.   Employment: retired.       Outpatient Encounter Medications as of 09/07/2023  Medication Sig   levothyroxine (SYNTHROID) 25 MCG tablet Take 25 mcg by mouth every morning.   No facility-administered encounter medications on file as of 09/07/2023.    Past Medical History:  Diagnosis Date   Arthritis    Years ago   GERD (gastroesophageal reflux disease)    Many years ago   Thyroid disease 06/23   Ulcer 35yrs.ago    Past Surgical History:  Procedure Laterality Date   EYE SURGERY  01/22/19   Cataracts   TUBAL LIGATION  Over 6yrs    Family History  Problem Relation Age of Onset   Cancer Father    Cancer Sister    COPD Sister     Social History   Socioeconomic History   Marital status: Widowed    Spouse name: Not on file   Number of children: Not on file   Years of education: Not on file   Highest education level: Not on file  Occupational History   Not on file  Tobacco Use   Smoking status: Former    Current  packs/day: 0.00    Average packs/day: 2.0 packs/day for 30.0 years (60.0 ttl pk-yrs)    Types: Cigarettes    Quit date: 98    Years since quitting: 37.7   Smokeless tobacco: Not on file  Substance and Sexual Activity   Alcohol use: Never   Drug use: Never   Sexual activity: Not Currently    Birth control/protection: Surgical    Comment: Tubal ligation  Other Topics Concern   Not on file  Social History Narrative   Not on file   Social Determinants of Health   Financial Resource Strain: Not on file  Food Insecurity: Not on file  Transportation Needs: Not on file  Physical Activity: Not on file  Stress: Not on file  Social Connections: Not on file  Intimate Partner Violence: Not on file    ROS      Objective    BP 122/79   Pulse 63   Ht 5' 4.5" (1.638 m)   Wt 135 lb 6.4 oz (61.4 kg)   SpO2 97%   BMI 22.88 kg/m   Physical Exam General: Alert, oriented HEENT: PERRLA, EOMI.  Hearing aids bilaterally.  False teeth. CV: Regular rate rhythm no murmurs Pulmonary: Lungs clear bilaterally GI: Soft, nontender MSK: Strength equal bilaterally. Extremities: No pedal edema. Skin: Warm and dry.  Tanned, solar lentigo  Assessment & Plan:   Hypothyroidism, unspecified type Assessment & Plan: Patient takes 25 mcg levothyroxine.  Requesting records from [facility] for previous TSH values.  Continue current dose.   Primary osteoarthritis involving multiple joints Assessment & Plan: Patient takes over-the-counter pain relieving medications.  Tries to use Voltaren gel as well but this is not very effective.  Has arthritis in multiple joints including hands hips and shoulders.     Allergic rhinitis, unspecified seasonality, unspecified trigger Assessment & Plan: Continue Claritin as needed     Return in about 1 year (around 09/06/2024) for physical.   Sandre Kitty, MD

## 2023-10-13 DIAGNOSIS — L821 Other seborrheic keratosis: Secondary | ICD-10-CM | POA: Diagnosis not present

## 2023-10-13 DIAGNOSIS — D692 Other nonthrombocytopenic purpura: Secondary | ICD-10-CM | POA: Diagnosis not present

## 2023-10-13 DIAGNOSIS — L570 Actinic keratosis: Secondary | ICD-10-CM | POA: Diagnosis not present

## 2023-10-13 DIAGNOSIS — D225 Melanocytic nevi of trunk: Secondary | ICD-10-CM | POA: Diagnosis not present

## 2023-11-09 DIAGNOSIS — H35372 Puckering of macula, left eye: Secondary | ICD-10-CM | POA: Diagnosis not present

## 2023-11-09 DIAGNOSIS — H04123 Dry eye syndrome of bilateral lacrimal glands: Secondary | ICD-10-CM | POA: Diagnosis not present

## 2023-11-09 DIAGNOSIS — H43822 Vitreomacular adhesion, left eye: Secondary | ICD-10-CM | POA: Diagnosis not present

## 2024-05-06 DIAGNOSIS — F325 Major depressive disorder, single episode, in full remission: Secondary | ICD-10-CM | POA: Diagnosis not present

## 2024-05-06 DIAGNOSIS — Z791 Long term (current) use of non-steroidal anti-inflammatories (NSAID): Secondary | ICD-10-CM | POA: Diagnosis not present

## 2024-05-06 DIAGNOSIS — Z887 Allergy status to serum and vaccine status: Secondary | ICD-10-CM | POA: Diagnosis not present

## 2024-05-06 DIAGNOSIS — E039 Hypothyroidism, unspecified: Secondary | ICD-10-CM | POA: Diagnosis not present

## 2024-05-06 DIAGNOSIS — J309 Allergic rhinitis, unspecified: Secondary | ICD-10-CM | POA: Diagnosis not present

## 2024-05-06 DIAGNOSIS — M1990 Unspecified osteoarthritis, unspecified site: Secondary | ICD-10-CM | POA: Diagnosis not present

## 2024-05-06 DIAGNOSIS — K219 Gastro-esophageal reflux disease without esophagitis: Secondary | ICD-10-CM | POA: Diagnosis not present

## 2024-05-06 DIAGNOSIS — Z87891 Personal history of nicotine dependence: Secondary | ICD-10-CM | POA: Diagnosis not present

## 2024-05-06 DIAGNOSIS — Z79890 Hormone replacement therapy (postmenopausal): Secondary | ICD-10-CM | POA: Diagnosis not present

## 2024-05-06 DIAGNOSIS — R030 Elevated blood-pressure reading, without diagnosis of hypertension: Secondary | ICD-10-CM | POA: Diagnosis not present

## 2024-05-06 DIAGNOSIS — Z8249 Family history of ischemic heart disease and other diseases of the circulatory system: Secondary | ICD-10-CM | POA: Diagnosis not present

## 2024-05-16 DIAGNOSIS — L570 Actinic keratosis: Secondary | ICD-10-CM | POA: Diagnosis not present

## 2024-05-16 DIAGNOSIS — L820 Inflamed seborrheic keratosis: Secondary | ICD-10-CM | POA: Diagnosis not present

## 2024-08-23 ENCOUNTER — Ambulatory Visit

## 2024-09-06 ENCOUNTER — Ambulatory Visit (INDEPENDENT_AMBULATORY_CARE_PROVIDER_SITE_OTHER): Payer: Medicare HMO | Admitting: Family Medicine

## 2024-09-06 ENCOUNTER — Encounter: Payer: Self-pay | Admitting: Family Medicine

## 2024-09-06 VITALS — BP 136/66 | HR 53 | Ht 64.5 in | Wt 131.1 lb

## 2024-09-06 DIAGNOSIS — E039 Hypothyroidism, unspecified: Secondary | ICD-10-CM

## 2024-09-06 DIAGNOSIS — R5383 Other fatigue: Secondary | ICD-10-CM

## 2024-09-06 DIAGNOSIS — M150 Primary generalized (osteo)arthritis: Secondary | ICD-10-CM | POA: Diagnosis not present

## 2024-09-06 DIAGNOSIS — Z13220 Encounter for screening for lipoid disorders: Secondary | ICD-10-CM

## 2024-09-06 DIAGNOSIS — M8580 Other specified disorders of bone density and structure, unspecified site: Secondary | ICD-10-CM | POA: Diagnosis not present

## 2024-09-06 DIAGNOSIS — Z0000 Encounter for general adult medical examination without abnormal findings: Secondary | ICD-10-CM | POA: Diagnosis not present

## 2024-09-06 DIAGNOSIS — R400 Somnolence: Secondary | ICD-10-CM | POA: Diagnosis not present

## 2024-09-06 NOTE — Assessment & Plan Note (Signed)
-   Presents with fatigue after stopping thyroid  medication over 6 weeks ago. Prior TSH on medication was normal at 1.67. Cause of fatigue is unclear, may be related to stopping medication, stress, or other underlying causes. - Order labs: TSH, CBC, CMP, Ferritin, B12. Will request prior lab records from [facility] to determine baseline TSH. - Will discuss restarting thyroid  medication if TSH is elevated. - Discussed other potential causes of fatigue including sleep apnea, stress, and age.

## 2024-09-06 NOTE — Assessment & Plan Note (Signed)
-   History of osteopenia on 2022 DEXA scan. Takes Vitamin D3 but avoids calcium due to history of kidney stones. - Order repeat DEXA scan. - Discussed management options, including bisphosphonates, if results show progression to osteoporosis. Will re-evaluate plan based on DEXA results.

## 2024-09-06 NOTE — Progress Notes (Signed)
 Annual physical  Subjective    Patient ID: Christina Fowler, female    DOB: 1941/09/16  Age: 83 y.o. MRN: [id number]  Chief Complaint  Patient presents with   Annual Exam   HPI Christina Fowler is a 83 y.o. old female here  for annual exam.   Subjective - Reports feeling tired and sluggish. Unsure of onset, but notes significant recent stress. Was feeling tired even while on thyroid  medication. - Requests thyroid  check. Stopped thyroid  medication over 6 weeks ago (last refill due around April 2025). Was on it for 2 years for hair loss but did not feel it was helping. Unaware of initial TSH levels.  Medications Reports stopping thyroid  medication (name not recalled). Currently takes Vitamin D3. Does not take calcium due to a history of calcium oxalate kidney stones. Denies taking flu or COVID vaccines. States shingles and pneumonia vaccines are up to date.  PMH, PSH, FH, Social Hx PMHx: Hypothyroidism (TSH was 1.67 on medication last year), osteopenia (per DEXA scan in 2022), osteoarthritis, history of calcium oxalate kidney stones. PSH: Dermatologist freezes skin lesions annually, no recent biopsies. FH: Not discussed. Social Hx: Lives alone. Enjoys reading as a hobby.  ROS Constitutional: Positive for fatigue. Negative for falling asleep unintentionally during the day. Endocrine: Negative for known symptoms of uncontrolled hypothyroidism besides fatigue. Derm: Positive for hair loss (historical). MSK: Positive for osteoarthritis pain. Negative for symptoms of osteoporosis. Psych: Positive for stress.   The ASCVD Risk score (Arnett DK, et al., 2019) failed to calculate for the following reasons:   The 2019 ASCVD risk score is only valid for ages 78 to 35  Health Maintenance Due  Topic Date Due   DTaP/Tdap/Td (1 - Tdap) Never done   Zoster Vaccines- Shingrix (1 of 2) 12/17/1990   Medicare Annual Wellness (AWV)  03/03/2021   COVID-19 Vaccine (1 - 2024-25 season) Never done       Objective:     BP 136/66   Pulse (!) 53   Ht 5' 4.5 (1.638 m)   Wt 131 lb 1.9 oz (59.5 kg)   SpO2 100%   BMI 22.16 kg/m    Physical Exam Gen: alert, oriented HEENT: perrla, eomi, mmm.  Wearing hearing aids CV: rrr, no murmur Pulm: lctab. No wheeze or crackles.  GI: soft, nbs.  Nontender to palpation MSK: strength equal b/l. Normal gait Ext: no pedal edema Skin: warm and dry, no rashes Psych: pleasant affect.  Spontaneous speech   No results found for any visits on 09/06/24.      Assessment & Plan:   Hypothyroidism, unspecified type Assessment & Plan: - Presents with fatigue after stopping thyroid  medication over 6 weeks ago. Prior TSH on medication was normal at 1.67. Cause of fatigue is unclear, may be related to stopping medication, stress, or other underlying causes. - Order labs: TSH, CBC, CMP, Ferritin, B12. Will request prior lab records from [place] to determine baseline TSH. - Will discuss restarting thyroid  medication if TSH is elevated. - Discussed other potential causes of fatigue including sleep apnea, stress, and age.  Orders: -     TSH + free T4  Primary osteoarthritis involving multiple joints  Osteopenia, unspecified location Assessment & Plan: - History of osteopenia on 2022 DEXA scan. Takes Vitamin D3 but avoids calcium due to history of kidney stones. - Order repeat DEXA scan. - Discussed management options, including bisphosphonates, if results show progression to osteoporosis. Will re-evaluate plan based on DEXA results.  Orders: -  Comprehensive metabolic panel with GFR -     VITAMIN D 25 Hydroxy (Vit-D Deficiency, Fractures)  Other fatigue -     CBC with Differential/Platelet -     Hemoglobin A1c -     TSH + free T4 -     Iron, TIBC and Ferritin Panel -     B12 and Folate Panel  Daytime somnolence -     CBC with Differential/Platelet -     Hemoglobin A1c -     TSH + free T4 -     Iron, TIBC and Ferritin Panel -     B12  and Folate Panel  Screening cholesterol level -     Lipid panel  Healthcare maintenance Assessment & Plan: - Discussed vaccinations. Declines influenza and COVID vaccines. States shingles and pneumonia are complete. Will clarify vaccination record. - Follow up in 6 months to review results and reassess fatigue. Will message with any abnormal results and see sooner if needed.      Return in about 6 months (around 03/07/2025) for Fatigue, TSH.    Christina MARLA Slain, MD

## 2024-09-06 NOTE — Patient Instructions (Signed)
 It was nice to see you today,  We addressed the following topics today: -I will check your labs including thyroid  level, and some other labs for fatigue. - I will let you know the results when I get them and if we need to make any changes to your management. - I am sending in a DEXA scan order.  Someone will call you to schedule this.  Have a great day,  Rolan Slain, MD

## 2024-09-06 NOTE — Assessment & Plan Note (Signed)
-   Discussed vaccinations. Declines influenza and COVID vaccines. States shingles and pneumonia are complete. Will clarify vaccination record. - Follow up in 6 months to review results and reassess fatigue. Will message with any abnormal results and see sooner if needed.

## 2024-09-20 ENCOUNTER — Ambulatory Visit: Payer: Self-pay | Admitting: Family Medicine

## 2024-09-20 LAB — CBC WITH DIFFERENTIAL/PLATELET
Basophils Absolute: 0 x10E3/uL (ref 0.0–0.2)
Basos: 1 %
EOS (ABSOLUTE): 0.1 x10E3/uL (ref 0.0–0.4)
Eos: 3 %
Hematocrit: 41.7 % (ref 34.0–46.6)
Hemoglobin: 13.4 g/dL (ref 11.1–15.9)
Immature Grans (Abs): 0 x10E3/uL (ref 0.0–0.1)
Immature Granulocytes: 0 %
Lymphocytes Absolute: 1 x10E3/uL (ref 0.7–3.1)
Lymphs: 25 %
MCH: 30 pg (ref 26.6–33.0)
MCHC: 32.1 g/dL (ref 31.5–35.7)
MCV: 94 fL (ref 79–97)
Monocytes Absolute: 0.2 x10E3/uL (ref 0.1–0.9)
Monocytes: 6 %
Neutrophils Absolute: 2.5 x10E3/uL (ref 1.4–7.0)
Neutrophils: 65 %
Platelets: 197 x10E3/uL (ref 150–450)
RBC: 4.46 x10E6/uL (ref 3.77–5.28)
RDW: 11.8 % (ref 11.7–15.4)
WBC: 3.9 x10E3/uL (ref 3.4–10.8)

## 2024-09-20 LAB — HEMOGLOBIN A1C
Est. average glucose Bld gHb Est-mCnc: 100 mg/dL
Hgb A1c MFr Bld: 5.1 % (ref 4.8–5.6)

## 2024-09-20 LAB — COMPREHENSIVE METABOLIC PANEL WITH GFR
ALT: 16 IU/L (ref 0–32)
AST: 24 IU/L (ref 0–40)
Albumin: 4.4 g/dL (ref 3.7–4.7)
Alkaline Phosphatase: 87 IU/L (ref 48–129)
BUN/Creatinine Ratio: 14 (ref 12–28)
BUN: 12 mg/dL (ref 8–27)
Bilirubin Total: 0.4 mg/dL (ref 0.0–1.2)
CO2: 23 mmol/L (ref 20–29)
Calcium: 9.7 mg/dL (ref 8.7–10.3)
Chloride: 104 mmol/L (ref 96–106)
Creatinine, Ser: 0.88 mg/dL (ref 0.57–1.00)
Globulin, Total: 2 g/dL (ref 1.5–4.5)
Glucose: 79 mg/dL (ref 70–99)
Potassium: 4.2 mmol/L (ref 3.5–5.2)
Sodium: 142 mmol/L (ref 134–144)
Total Protein: 6.4 g/dL (ref 6.0–8.5)
eGFR: 65 mL/min/1.73 (ref >59)

## 2024-09-20 LAB — IRON,TIBC AND FERRITIN PANEL
Ferritin: 95 ng/mL (ref 15–150)
Iron Saturation: 38 % (ref 15–55)
Iron: 108 ug/dL (ref 27–139)
Total Iron Binding Capacity: 283 ug/dL (ref 250–450)
UIBC: 175 ug/dL (ref 118–369)

## 2024-09-20 LAB — LIPID PANEL
Chol/HDL Ratio: 4.2 ratio (ref 0.0–4.4)
Cholesterol, Total: 234 mg/dL — ABNORMAL HIGH (ref 100–199)
HDL: 56 mg/dL (ref >39)
LDL Chol Calc (NIH): 152 mg/dL — ABNORMAL HIGH (ref 0–99)
Triglycerides: 143 mg/dL (ref 0–149)
VLDL Cholesterol Cal: 26 mg/dL (ref 5–40)

## 2024-09-20 LAB — TSH+FREE T4
Free T4: 1.02 ng/dL (ref 0.82–1.77)
TSH: 4.07 u[IU]/mL (ref 0.450–4.500)

## 2024-09-20 LAB — B12 AND FOLATE PANEL
Folate: >20 ng/mL (ref >3.0)
Vitamin B-12: 568 pg/mL (ref 232–1245)

## 2024-09-20 LAB — VITAMIN D 25 HYDROXY (VIT D DEFICIENCY, FRACTURES): Vit D, 25-Hydroxy: 112 ng/mL — ABNORMAL HIGH (ref 30.0–100.0)

## 2024-10-15 DIAGNOSIS — D485 Neoplasm of uncertain behavior of skin: Secondary | ICD-10-CM | POA: Diagnosis not present

## 2024-10-15 DIAGNOSIS — L570 Actinic keratosis: Secondary | ICD-10-CM | POA: Diagnosis not present

## 2024-10-15 DIAGNOSIS — D692 Other nonthrombocytopenic purpura: Secondary | ICD-10-CM | POA: Diagnosis not present

## 2024-10-15 DIAGNOSIS — D0471 Carcinoma in situ of skin of right lower limb, including hip: Secondary | ICD-10-CM | POA: Diagnosis not present

## 2024-10-15 DIAGNOSIS — D2239 Melanocytic nevi of other parts of face: Secondary | ICD-10-CM | POA: Diagnosis not present

## 2024-10-15 DIAGNOSIS — L821 Other seborrheic keratosis: Secondary | ICD-10-CM | POA: Diagnosis not present

## 2024-10-15 DIAGNOSIS — L820 Inflamed seborrheic keratosis: Secondary | ICD-10-CM | POA: Diagnosis not present

## 2025-01-10 ENCOUNTER — Ambulatory Visit

## 2025-03-07 ENCOUNTER — Ambulatory Visit: Admitting: Family Medicine

## 2025-04-04 ENCOUNTER — Ambulatory Visit
